# Patient Record
Sex: Male | Born: 2000 | Race: Black or African American | Hispanic: No | Marital: Single | Smoking: Never smoker
Health system: Southern US, Community
[De-identification: ages and names within clinical notes are randomized; demographics above are authoritative.]

## PROBLEM LIST (undated history)

## (undated) DIAGNOSIS — R069 Unspecified abnormalities of breathing: Secondary | ICD-10-CM

## (undated) DIAGNOSIS — R0789 Other chest pain: Secondary | ICD-10-CM

## (undated) HISTORY — PX: MYRINGOTOMY WITH TUBE PLACEMENT: SHX5663

---
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
## 2023-03-18 ENCOUNTER — Other Ambulatory Visit: Payer: Self-pay

## 2023-03-18 ENCOUNTER — Emergency Department (HOSPITAL_BASED_OUTPATIENT_CLINIC_OR_DEPARTMENT_OTHER)
Admission: EM | Admit: 2023-03-18 | Discharge: 2023-03-18 | Disposition: A | Discharge status: Home / Self Care | Payer: 59 | Attending: Emergency Medicine | Admitting: Emergency Medicine

## 2023-03-18 ENCOUNTER — Encounter (HOSPITAL_BASED_OUTPATIENT_CLINIC_OR_DEPARTMENT_OTHER): Payer: Self-pay | Admitting: Emergency Medicine

## 2023-03-18 ENCOUNTER — Emergency Department (HOSPITAL_BASED_OUTPATIENT_CLINIC_OR_DEPARTMENT_OTHER): Payer: 59

## 2023-03-18 DIAGNOSIS — H9201 Otalgia, right ear: Secondary | ICD-10-CM | POA: Insufficient documentation

## 2023-03-18 DIAGNOSIS — J45909 Unspecified asthma, uncomplicated: Secondary | ICD-10-CM | POA: Insufficient documentation

## 2023-03-18 DIAGNOSIS — Z7951 Long term (current) use of inhaled steroids: Secondary | ICD-10-CM | POA: Diagnosis not present

## 2023-03-18 DIAGNOSIS — S0990XA Unspecified injury of head, initial encounter: Secondary | ICD-10-CM | POA: Insufficient documentation

## 2023-03-18 DIAGNOSIS — W228XXA Striking against or struck by other objects, initial encounter: Secondary | ICD-10-CM | POA: Diagnosis not present
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
